# Patient Record
Sex: Female | Born: 1960 | Race: Black or African American | Hispanic: No | Marital: Single | Smoking: Current every day smoker
Health system: Southern US, Community
[De-identification: ages and names within clinical notes are randomized; demographics above are authoritative.]

## PROBLEM LIST (undated history)

## (undated) DIAGNOSIS — I639 Cerebral infarction, unspecified: Secondary | ICD-10-CM

## (undated) DIAGNOSIS — N3281 Overactive bladder: Secondary | ICD-10-CM

## (undated) DIAGNOSIS — K5900 Constipation, unspecified: Secondary | ICD-10-CM

## (undated) DIAGNOSIS — I69354 Hemiplegia and hemiparesis following cerebral infarction affecting left non-dominant side: Secondary | ICD-10-CM

## (undated) DIAGNOSIS — IMO0002 Reserved for concepts with insufficient information to code with codable children: Secondary | ICD-10-CM

## (undated) DIAGNOSIS — K219 Gastro-esophageal reflux disease without esophagitis: Secondary | ICD-10-CM

## (undated) DIAGNOSIS — I10 Essential (primary) hypertension: Secondary | ICD-10-CM

## (undated) DIAGNOSIS — R5381 Other malaise: Secondary | ICD-10-CM

## (undated) DIAGNOSIS — I69391 Dysphagia following cerebral infarction: Secondary | ICD-10-CM

## (undated) DIAGNOSIS — F17200 Nicotine dependence, unspecified, uncomplicated: Secondary | ICD-10-CM

## (undated) DIAGNOSIS — E875 Hyperkalemia: Secondary | ICD-10-CM

## (undated) DIAGNOSIS — D62 Acute posthemorrhagic anemia: Secondary | ICD-10-CM

## (undated) DIAGNOSIS — M4802 Spinal stenosis, cervical region: Secondary | ICD-10-CM

## (undated) HISTORY — DX: Overactive bladder: N32.81

## (undated) HISTORY — DX: Reserved for concepts with insufficient information to code with codable children: IMO0002

## (undated) HISTORY — DX: Hyperkalemia: E87.5

## (undated) HISTORY — DX: Acute posthemorrhagic anemia: D62

## (undated) HISTORY — DX: Hemiplegia and hemiparesis following cerebral infarction affecting left non-dominant side: I69.354

## (undated) HISTORY — DX: Gastro-esophageal reflux disease without esophagitis: K21.9

## (undated) HISTORY — DX: Constipation, unspecified: K59.00

## (undated) HISTORY — DX: Essential (primary) hypertension: I10

## (undated) HISTORY — DX: Cerebral infarction, unspecified: I63.9

## (undated) HISTORY — DX: Dysphagia following cerebral infarction: I69.391

## (undated) HISTORY — DX: Other malaise: R53.81

## (undated) HISTORY — DX: Nicotine dependence, unspecified, uncomplicated: F17.200

## (undated) HISTORY — DX: Spinal stenosis, cervical region: M48.02

---

## 2015-05-06 ENCOUNTER — Non-Acute Institutional Stay (SKILLED_NURSING_FACILITY): Payer: BLUE CROSS/BLUE SHIELD | Admitting: Internal Medicine

## 2015-05-06 DIAGNOSIS — I10 Essential (primary) hypertension: Secondary | ICD-10-CM

## 2015-05-06 DIAGNOSIS — I69354 Hemiplegia and hemiparesis following cerebral infarction affecting left non-dominant side: Secondary | ICD-10-CM

## 2015-05-06 DIAGNOSIS — I69391 Dysphagia following cerebral infarction: Secondary | ICD-10-CM

## 2015-05-06 DIAGNOSIS — K5900 Constipation, unspecified: Secondary | ICD-10-CM | POA: Diagnosis not present

## 2015-05-06 DIAGNOSIS — K219 Gastro-esophageal reflux disease without esophagitis: Secondary | ICD-10-CM | POA: Diagnosis not present

## 2015-05-06 DIAGNOSIS — R471 Dysarthria and anarthria: Secondary | ICD-10-CM

## 2015-05-06 DIAGNOSIS — N3281 Overactive bladder: Secondary | ICD-10-CM

## 2015-05-06 DIAGNOSIS — R5381 Other malaise: Secondary | ICD-10-CM

## 2015-05-06 DIAGNOSIS — I639 Cerebral infarction, unspecified: Secondary | ICD-10-CM | POA: Diagnosis not present

## 2015-05-06 DIAGNOSIS — E875 Hyperkalemia: Secondary | ICD-10-CM

## 2015-05-06 DIAGNOSIS — M4802 Spinal stenosis, cervical region: Secondary | ICD-10-CM | POA: Diagnosis not present

## 2015-05-06 DIAGNOSIS — D62 Acute posthemorrhagic anemia: Secondary | ICD-10-CM

## 2015-05-06 DIAGNOSIS — IMO0002 Reserved for concepts with insufficient information to code with codable children: Secondary | ICD-10-CM

## 2015-05-06 DIAGNOSIS — F17200 Nicotine dependence, unspecified, uncomplicated: Secondary | ICD-10-CM

## 2015-05-06 DIAGNOSIS — M62838 Other muscle spasm: Secondary | ICD-10-CM

## 2015-05-06 NOTE — Progress Notes (Signed)
Patient ID: Nancy Ray, female   DOB: September 27, 1960, 55 y.o.   MRN: [id number]      [facility]   PCP: No primary care provider on file.  Code Status: full code  No Known Allergies  Chief Complaint  Patient presents with  . New Admit To SNF     HPI:  55 y.o. patient is here for short term rehabilitation post hospital admission and inpatient rehabilitation stay from 04/07/15-05/04/15 with a CVA with left sided weakness. She was diagnosed with right basal ganglia embolic stroke. She was seen by neurology and is on plavix, aspirin and statin. She was seen by neurosurgery and not deemed a surgical candidate. She underwent inpatient rehabilitation. She received botox injection  For her left lower leg. She was treated for e.coli uti this admission. She received 1 u prbc this admission for blood loss anemia thought to be from gi bleed.  She has PMH of severe cervical stenosis with myelopathy, HTN, tobacco use, CVA, HLD among others. She is seen in her room today. She has dysarthria and left sided weakness. She has been constipated. Denies any other concerns.   Review of Systems:  Constitutional: Negative for fever, chills, diaphoresis.  HENT: Negative for headache, congestion, nasal discharge Eyes: Negative for blurred vision, double vision and discharge.  Respiratory: Negative for cough, shortness of breath and wheezing.   Cardiovascular: Negative for chest pain, palpitations, leg swelling.  Gastrointestinal: Negative for heartburn, nausea, vomiting, abdominal pain Genitourinary: Negative for dysuria and flank pain.  Musculoskeletal: Negative for back pain, falls in the facility Skin: Negative for itching, rash.  Neurological: positive for focal weakness Psychiatric/Behavioral: Negative for depression  PMH- HLD, HTN, CVA  PSH- Cesarean section  Social history- tobacco use 1 pack a day, denies alcohol and illicit drug use  Family history- cancer and  HTN in father and mother, stroke in mother and maternal grandmother   Medications:   Medication List       This list is accurate as of: 05/06/15  3:33 PM.  Always use your most recent med list.               amLODipine 10 MG tablet  Commonly known as:  NORVASC  Take 10 mg by mouth daily.     aspirin 81 MG tablet  Take 81 mg by mouth daily.     atorvastatin 80 MG tablet  Commonly known as:  LIPITOR  Take 80 mg by mouth daily.     baclofen 10 MG tablet  Commonly known as:  LIORESAL  Take 5 mg by mouth 3 (three) times daily.     clopidogrel 75 MG tablet  Commonly known as:  PLAVIX  Take 75 mg by mouth daily.     gabapentin 300 MG capsule  Commonly known as:  NEURONTIN  Take 300 mg by mouth 3 (three) times daily.     hydrochlorothiazide 12.5 MG capsule  Commonly known as:  MICROZIDE  Take 12.5 mg by mouth daily.     lidocaine 5 %  Commonly known as:  LIDODERM  Place 1 patch onto the skin daily. Remove & Discard patch within 12 hours or as directed by MD     oxybutynin 5 MG tablet  Commonly known as:  DITROPAN  Take 2.5 mg by mouth 2 (two) times daily.     pantoprazole 40 MG tablet  Commonly known as:  PROTONIX  Take 40 mg by mouth 2 (two) times daily.  sennosides-docusate sodium 8.6-50 MG tablet  Commonly known as:  SENOKOT-S  Take 2 tablets by mouth daily.     traMADol 50 MG tablet  Commonly known as:  ULTRAM  Take 50 mg by mouth every 6 (six) hours as needed.         Physical Exam: Filed Vitals:   05/06/15 1525  BP: 151/81  Pulse: 89  Temp: 98 F (36.7 C)  Resp: 18  SpO2: 96%    General- elderly female, in no acute distress Head- normocephalic, atraumatic Nose- no maxillary or frontal sinus tenderness, no nasal discharge Throat- moist mucus membrane Eyes- no pallor, no icterus, no discharge, normal conjunctiva, normal sclera Neck- no cervical lymphadenopathy Cardiovascular- normal s1,s2, no murmurs, no leg edema Respiratory- bilateral  clear to auscultation, no wheeze, no rhonchi, no crackles, no use of accessory muscles Abdomen- bowel sounds present, soft, non tender Musculoskeletal- left sided hemiparesis, able to move her RUE and RLE, no spinal and paraspinal tenderness Neurological- dysarhtira present, alert and oriented to person, place and time Skin- warm and dry Psychiatry- normal mood and affect    Labs and imaging reviewed: see chart for details   Assessment/Plan  Physical deconditioning Will have her work with physical therapy and occupational therapy team to help with gait training and muscle strengthening exercises.fall precautions. Skin care. Encourage to be out of bed.   Acute embolic stroke With left sided hemiparesis. Will have patient work with PT/OT as tolerated to regain strength and restore function.  Fall precautions are in place. Continue aspirin, plavix and lipitor. Check lipid panel and a1c  Left sided hemiparesis post cva, continue baclofen for muscle spasm. To work with therapy team. Fall precautions  Dysphagia Post CVA. Currently on dysphagia diet, aspiration precautions and will get SLP to evaluate. Continue puree diet for now.  Dysarthria To work with SLP team  Left sided spasticity Continue baclofen 5 mg tid and to work with therapy team  Blood loss anemia S/p 1 u prbc transfusion, check cbc  HTN Elevated SBP. Currently on norvasc 10 mg daily, hctz 12.5 mg daily. Was on clonidine, hydralazine and lisinopril prior. Check bp bid for now and adjust medication if needed   Cervical stenosis with myelopathy Not a surgical candidate. Continue tramadol prn for pain with lidocaine patch and neurontin  Constipation D/c colace. Continue senokot and start miralax 17 g daily with prn dulcolax suppository  Hyperkalemia In hospital, check bmp  Tobacco use Start nicotine patch 21 mg/24 hr for now and monitor  gerd Controlled symptom, continue protonix as above  OAB Continue  ditropan   Goals of care: short term rehabilitation   Labs/tests ordered: cbc, cmp, a1c, lipid  Family/ staff Communication: reviewed care plan with patient and nursing supervisor    Oneal Grout, MD  [facility] [phone] (Monday-Friday 8 am - 5 pm) [phone] (afterhours)

## 2015-05-11 LAB — HEPATIC FUNCTION PANEL
ALK PHOS: 56 U/L (ref 25–125)
ALT: 15 U/L (ref 7–35)
AST: 17 U/L (ref 13–35)
Bilirubin, Total: 0.5 mg/dL

## 2015-05-11 LAB — LIPID PANEL
CHOLESTEROL: 116 mg/dL (ref 0–200)
HDL: 38 mg/dL (ref 35–70)
LDL Cholesterol: 64 mg/dL
Triglycerides: 71 mg/dL (ref 40–160)

## 2015-05-11 LAB — CBC AND DIFFERENTIAL
HCT: 25 % — AB (ref 36–46)
HEMOGLOBIN: 8.3 g/dL — AB (ref 12.0–16.0)
Neutrophils Absolute: 2 /uL
PLATELETS: 203 10*3/uL (ref 150–399)
WBC: 4.4 10*3/mL

## 2015-05-11 LAB — BASIC METABOLIC PANEL
BUN: 10 mg/dL (ref 4–21)
CREATININE: 1.2 mg/dL — AB (ref 0.5–1.1)
Glucose: 100 mg/dL
POTASSIUM: 3.5 mmol/L (ref 3.4–5.3)
Sodium: 134 mmol/L — AB (ref 137–147)

## 2015-05-11 LAB — HEMOGLOBIN A1C: HEMOGLOBIN A1C: 5.4

## 2015-05-29 ENCOUNTER — Non-Acute Institutional Stay (SKILLED_NURSING_FACILITY): Payer: BLUE CROSS/BLUE SHIELD | Admitting: Adult Health

## 2015-05-29 ENCOUNTER — Encounter: Payer: Self-pay | Admitting: Adult Health

## 2015-05-29 DIAGNOSIS — D62 Acute posthemorrhagic anemia: Secondary | ICD-10-CM | POA: Diagnosis not present

## 2015-05-29 DIAGNOSIS — IMO0002 Reserved for concepts with insufficient information to code with codable children: Secondary | ICD-10-CM

## 2015-05-29 DIAGNOSIS — I69391 Dysphagia following cerebral infarction: Secondary | ICD-10-CM

## 2015-05-29 DIAGNOSIS — I69354 Hemiplegia and hemiparesis following cerebral infarction affecting left non-dominant side: Secondary | ICD-10-CM

## 2015-05-29 DIAGNOSIS — N3281 Overactive bladder: Secondary | ICD-10-CM

## 2015-05-29 DIAGNOSIS — K219 Gastro-esophageal reflux disease without esophagitis: Secondary | ICD-10-CM

## 2015-05-29 DIAGNOSIS — I639 Cerebral infarction, unspecified: Secondary | ICD-10-CM | POA: Diagnosis not present

## 2015-05-29 DIAGNOSIS — M4802 Spinal stenosis, cervical region: Secondary | ICD-10-CM

## 2015-05-29 DIAGNOSIS — K5900 Constipation, unspecified: Secondary | ICD-10-CM

## 2015-05-29 DIAGNOSIS — R471 Dysarthria and anarthria: Secondary | ICD-10-CM

## 2015-05-29 DIAGNOSIS — I10 Essential (primary) hypertension: Secondary | ICD-10-CM

## 2015-05-29 DIAGNOSIS — F17200 Nicotine dependence, unspecified, uncomplicated: Secondary | ICD-10-CM

## 2015-05-29 NOTE — Progress Notes (Signed)
Patient ID: Nancy Ray, female   DOB: 29-Apr-1960, 55 y.o.   MRN: [id number]    DATE:  05/29/15  MRN:  [id number]  BIRTHDAY: November 27, 1960  Facility:  Nursing Home Location:  [facility]  Nursing Home Room Number: 1001-2  LEVEL OF CARE:  SNF (31)  Contact Information    Name Relation Home Work Mobile   [place] Daughter [phone]     [place], [place] [phone]         Code Status History    This patient does not have a recorded code status. Please follow your organizational policy for patients in this situation.       Chief Complaint  Patient presents with  . Discharge Note    HISTORY OF PRESENT ILLNESS:  This is a 55 year old female who is for discharge home with home health OT for ADLs, PT for endurance, ST for swallowing, skilled Nurse for disease management and CNA for bath and showers. DME:  3 in 1 commode, standard wheelchair 16" deep X 16" wide, removable elevating leg rests and desk length arm rest, cushion. She has been admitted to [facility] on 05/05/15 from [facility] with CVA with a left sided weakness.She has PMH of severe cervical stenosis with myelopathy, hypertension, tobacco use, CVA and hyperlipidemia. She was diagnosed with right basal ganglia embolic stroke. She was seen by neurology and is on Plavix, aspirin and statin. She was seen by neurosurgery and not deemed a surgical candidate. She underwent inpatient rehabilitation. She received Botox injection for her left lower leg. She was treated for Escherichia coli UTI, as well. She received chest fusion of 1 unit packed RBC for blood loss anemia thought to be from GI bleed.   Patient was admitted to this facility for short-term rehabilitation after the patient's recent hospitalization.  Patient has completed SNF rehabilitation and therapy has cleared the patient for discharge.   PAST MEDICAL HISTORY:  Past Medical History  Diagnosis Date  . Physical  deconditioning   . Acute embolic stroke (HCC)   . Hemiplegia and hemiparesis following cerebral infarction affecting left non-dominant side (HCC)   . Acute blood loss anemia   . Constipation   . Benign essential HTN   . OAB (overactive bladder)   . GERD (gastroesophageal reflux disease)   . Tobacco use disorder   . Hyperkalemia   . Spinal stenosis in cervical region   . Dysphagia S/P CVA (cerebrovascular accident)   . Dysarthria due to cerebrovascular accident (CVA) (HCC)      CURRENT MEDICATIONS: Reviewed  Patient's Medications  New Prescriptions   No medications on file  Previous Medications   AMLODIPINE (NORVASC) 10 MG TABLET    Take 10 mg by mouth daily.   ASPIRIN 81 MG TABLET    Take 81 mg by mouth daily.   ATORVASTATIN (LIPITOR) 80 MG TABLET    Take 80 mg by mouth daily.   BACLOFEN (LIORESAL) 10 MG TABLET    Take 5 mg by mouth 3 (three) times daily.   BISACODYL (DULCOLAX) 10 MG SUPPOSITORY    Place 10 mg rectally daily as needed for moderate constipation.   CLONIDINE (CATAPRES) 0.1 MG TABLET    Take 0.1 mg by mouth 2 (two) times daily. Take 1/2 to 1 tab BID PRN SBP >160   CLOPIDOGREL (PLAVIX) 75 MG TABLET    Take 75 mg by mouth daily.   GABAPENTIN (NEURONTIN) 300 MG CAPSULE    Take 300 mg by mouth  3 (three) times daily.   HYDROCHLOROTHIAZIDE (MICROZIDE) 12.5 MG CAPSULE    Take 12.5 mg by mouth daily.   LIDOCAINE (LIDODERM) 5 %    Place 2 patches onto the skin daily. Remove & Discard patch within 12 hours or as directed by MD   NICOTINE (NICODERM CQ - DOSED IN MG/24 HOURS) 21 MG/24HR PATCH    Place 21 mg onto the skin daily.   OXYBUTYNIN (DITROPAN) 5 MG TABLET    Take 2.5 mg by mouth 2 (two) times daily.   POLYETHYLENE GLYCOL (MIRALAX / GLYCOLAX) PACKET    Take 17 g by mouth daily.   RANITIDINE (ZANTAC) 150 MG TABLET    Take 150 mg by mouth 2 (two) times daily.   SENNOSIDES-DOCUSATE SODIUM (SENOKOT-S) 8.6-50 MG TABLET    Take 2 tablets by mouth daily.    TRAMADOL (ULTRAM) 50  MG TABLET    Take 50 mg by mouth every 6 (six) hours as needed.  Modified Medications   No medications on file  Discontinued Medications   No medications on file     No Known Allergies   REVIEW OF SYSTEMS:  GENERAL: no change in appetite, no fatigue, no weight changes, no fever, chills or weakness SKIN: Denies rash, itching, wounds, ulcer sores, or nail abnormality EYES: Denies change in vision, dry eyes, eye pain, itching or discharge EARS: Denies change in hearing, ringing in ears, or earache NOSE: Denies nasal congestion or epistaxis MOUTH and THROAT: Denies oral discomfort, gingival pain or bleeding, pain from teeth or hoarseness   RESPIRATORY: no cough, SOB, DOE, wheezing, hemoptysis CARDIAC: no chest pain, edema or palpitations GI: no abdominal pain, diarrhea, constipation, heart burn, nausea or vomiting GU: Denies dysuria, frequency, hematuria, incontinence, or discharge PSYCHIATRIC: Denies feeling of depression or anxiety. No report of hallucinations, insomnia, paranoia, or agitation    PHYSICAL EXAMINATION  GENERAL APPEARANCE: Well nourished. In no acute distress. Normal body habitus SKIN:  Skin is warm and dry. There are no suspicious lesions or rash HEAD: Normal in size and contour. No evidence of trauma EYES: Lids open and close normally. No blepharitis, entropion or ectropion. PERRL. Conjunctivae are clear and sclerae are white. Lenses are without opacity EARS: Pinnae are normal. Patient hears normal voice tunes of the examiner MOUTH and THROAT: Lips are without lesions. Oral mucosa is moist and without lesions. Tongue is normal in shape, size, and color and without lesions NECK: supple, trachea midline, no neck masses, no thyroid tenderness, no thyromegaly LYMPHATICS: no LAN in the neck, no supraclavicular LAN RESPIRATORY: breathing is even & unlabored, BS CTAB CARDIAC: RRR, no murmur,no extra heart sounds, no edema GI: abdomen soft, normal BS, no masses, no  tenderness, no hepatomegaly, no splenomegaly EXTREMITIES:  Left hemiplegia, uses wheelchair PSYCHIATRIC: Alert and oriented X 3. Affect and behavior are appropriate  LABS/RADIOLOGY: Labs reviewed: Basic Metabolic Panel:  Recent Labs  16/10/96  NA 134*  K 3.5  BUN 10  CREATININE 1.2*   Liver Function Tests:  Recent Labs  05/11/15  AST 17  ALT 15  ALKPHOS 56   CBC:  Recent Labs  05/11/15  WBC 4.4  NEUTROABS 2  HGB 8.3*  HCT 25*  PLT 203   Lipid Panel:  Recent Labs  05/11/15  HDL 38     ASSESSMENT/PLAN:  CVA with left-sided hemiparesis - for home health PT, OT, ST, CNA and skilled Nurse; continue Plavix 75 mg 1 tab by mouth daily, baclofen 10 mg 1 tab 3 times  a day  and aspirin 81 mg 1 tab daily  Dysphagia - continue mechanical soft NAS diet; aspiration precaution  Dysarthria - will be followed up by home health ST  Anemia, acute blood loss - S/P revision of 1 unit packed RBC, hemoglobin 8.3  Hypertension - continue hydrochlorothiazide 12.5 mg 1 tab daily, Norvasc 10 mg 1 tab daily, clonidine 0.1 mg 1/2 tab = 0.05 mg PO BID and Clonidine 0.1 mg 1 tab PO BID PRN  Cervical stenosis with myelopathy - not a surgical candidate per neurosurgery; continue tramadol when necessary, gabapentin 300 mg 1 capsule 3 times a day and lidocaine patch 5% 2 patches daily for pain  Tobacco use - continue nicotine patch 21 mg/24-hour daily  GERD - continue Zantac 150 mg 1 tab by mouth twice a day  OAB - continue oxybutynin 5 mg 1/2 tab = 2.5 mg BID      I have filled out patient's discharge paperwork and written prescriptions.  Patient will receive home health PT, OT, ST, skilled Nursing and CNA.  DME provided:  3 in 1 commode, standard wheelchair 16" deep X 16" wide, removable elevating leg rests and desk length arm rest, cushion  Total discharge time: Greater than 30 minutes  Discharge time involved coordination of the discharge process with social worker, nursing  staff and therapy department. Medical justification for home health services/DME verified.     [facility], NP [employer] [phone]

## 2015-06-13 ENCOUNTER — Encounter (HOSPITAL_BASED_OUTPATIENT_CLINIC_OR_DEPARTMENT_OTHER): Payer: Self-pay | Admitting: Emergency Medicine

## 2015-06-13 ENCOUNTER — Emergency Department (HOSPITAL_BASED_OUTPATIENT_CLINIC_OR_DEPARTMENT_OTHER)
Admission: EM | Admit: 2015-06-13 | Discharge: 2015-06-13 | Disposition: A | Discharge status: Home / Self Care | Payer: BLUE CROSS/BLUE SHIELD | Attending: Emergency Medicine | Admitting: Emergency Medicine

## 2015-06-13 ENCOUNTER — Emergency Department (HOSPITAL_BASED_OUTPATIENT_CLINIC_OR_DEPARTMENT_OTHER): Payer: BLUE CROSS/BLUE SHIELD

## 2015-06-13 DIAGNOSIS — Y998 Other external cause status: Secondary | ICD-10-CM | POA: Insufficient documentation

## 2015-06-13 DIAGNOSIS — Y9389 Activity, other specified: Secondary | ICD-10-CM | POA: Insufficient documentation

## 2015-06-13 DIAGNOSIS — K219 Gastro-esophageal reflux disease without esophagitis: Secondary | ICD-10-CM | POA: Diagnosis not present

## 2015-06-13 DIAGNOSIS — Z8639 Personal history of other endocrine, nutritional and metabolic disease: Secondary | ICD-10-CM | POA: Insufficient documentation

## 2015-06-13 DIAGNOSIS — Z862 Personal history of diseases of the blood and blood-forming organs and certain disorders involving the immune mechanism: Secondary | ICD-10-CM | POA: Insufficient documentation

## 2015-06-13 DIAGNOSIS — Z7982 Long term (current) use of aspirin: Secondary | ICD-10-CM | POA: Diagnosis not present

## 2015-06-13 DIAGNOSIS — W06XXXA Fall from bed, initial encounter: Secondary | ICD-10-CM | POA: Diagnosis not present

## 2015-06-13 DIAGNOSIS — F17210 Nicotine dependence, cigarettes, uncomplicated: Secondary | ICD-10-CM | POA: Insufficient documentation

## 2015-06-13 DIAGNOSIS — I10 Essential (primary) hypertension: Secondary | ICD-10-CM | POA: Diagnosis not present

## 2015-06-13 DIAGNOSIS — K5900 Constipation, unspecified: Secondary | ICD-10-CM | POA: Insufficient documentation

## 2015-06-13 DIAGNOSIS — Y9289 Other specified places as the place of occurrence of the external cause: Secondary | ICD-10-CM | POA: Diagnosis not present

## 2015-06-13 DIAGNOSIS — Z79899 Other long term (current) drug therapy: Secondary | ICD-10-CM | POA: Insufficient documentation

## 2015-06-13 DIAGNOSIS — Z8673 Personal history of transient ischemic attack (TIA), and cerebral infarction without residual deficits: Secondary | ICD-10-CM | POA: Insufficient documentation

## 2015-06-13 DIAGNOSIS — Z87448 Personal history of other diseases of urinary system: Secondary | ICD-10-CM | POA: Insufficient documentation

## 2015-06-13 DIAGNOSIS — Z7902 Long term (current) use of antithrombotics/antiplatelets: Secondary | ICD-10-CM | POA: Insufficient documentation

## 2015-06-13 DIAGNOSIS — S79912A Unspecified injury of left hip, initial encounter: Secondary | ICD-10-CM | POA: Insufficient documentation

## 2015-06-13 DIAGNOSIS — M25552 Pain in left hip: Secondary | ICD-10-CM

## 2015-06-13 NOTE — ED Notes (Signed)
Pt in c/o L hip pain onset after falling 3 weeks ago. Was not evaluated at that time, but is concerned now because of continued pain and swelling. Pt is not ambulatory at baseline.

## 2015-06-13 NOTE — ED Provider Notes (Signed)
CSN: [id number]     Arrival date & time 06/13/15  1407 History   First MD Initiated Contact with Patient 06/13/15 1425     Chief Complaint  Patient presents with  . Hip Pain  . Fall     (Consider location/radiation/quality/duration/timing/severity/associated sxs/prior Treatment) HPI Kinaya Hilliker is a 55 y.o. female with PMH significant for CVA with left sided hemiplegia and hemiparesis who presents with constant, moderate, intermittent left hip pain. Patient reports she fell out of her bed 3 weeks ago landing on her left hip. Denies LOC or head injury. At the time, patient did not feel she needed to seek medical treatment. Associated symptoms include swelling that is relieved with ice. No medications PTA. Denies numbness, tingling, acute weakness, chest pain, shortness of breath. Patient is not ambulatory at baseline.  Past Medical History  Diagnosis Date  . Physical deconditioning   . Acute embolic stroke (HCC)   . Hemiplegia and hemiparesis following cerebral infarction affecting left non-dominant side (HCC)   . Acute blood loss anemia   . Constipation   . Benign essential HTN   . OAB (overactive bladder)   . GERD (gastroesophageal reflux disease)   . Tobacco use disorder   . Hyperkalemia   . Spinal stenosis in cervical region   . Dysphagia S/P CVA (cerebrovascular accident)   . Dysarthria due to cerebrovascular accident (CVA) [facility])    Past Surgical History  Procedure Laterality Date  . Cesarean section     History reviewed. No pertinent family history. Social History  Substance Use Topics  . Smoking status: Current Every Day Smoker -- 1.00 packs/day    Types: Cigarettes  . Smokeless tobacco: None  . Alcohol Use: No   OB History    No data available     Review of Systems All other systems negative unless otherwise stated in HPI    Allergies  Review of patient's allergies indicates no known allergies.  Home Medications   Prior to Admission medications    Medication Sig Start Date End Date Taking? Authorizing Provider  amLODipine (NORVASC) 10 MG tablet Take 10 mg by mouth daily.    Historical Provider, MD  aspirin 81 MG tablet Take 81 mg by mouth daily.    Historical Provider, MD  atorvastatin (LIPITOR) 80 MG tablet Take 80 mg by mouth daily.    Historical Provider, MD  baclofen (LIORESAL) 10 MG tablet Take 5 mg by mouth 3 (three) times daily.    Historical Provider, MD  bisacodyl (DULCOLAX) 10 MG suppository Place 10 mg rectally daily as needed for moderate constipation.    Historical Provider, MD  cloNIDine (CATAPRES) 0.1 MG tablet Take 0.1 mg by mouth 2 (two) times daily. Take 1/2 to 1 tab BID PRN SBP >160    Historical Provider, MD  clopidogrel (PLAVIX) 75 MG tablet Take 75 mg by mouth daily.    Historical Provider, MD  gabapentin (NEURONTIN) 300 MG capsule Take 300 mg by mouth 3 (three) times daily.    Historical Provider, MD  hydrochlorothiazide (MICROZIDE) 12.5 MG capsule Take 12.5 mg by mouth daily.    Historical Provider, MD  lidocaine (LIDODERM) 5 % Place 2 patches onto the skin daily. Remove & Discard patch within 12 hours or as directed by MD    Historical Provider, MD  nicotine (NICODERM CQ - DOSED IN MG/24 HOURS) 21 mg/24hr patch Place 21 mg onto the skin daily.    Historical Provider, MD  oxybutynin (DITROPAN) 5 MG tablet Take 2.5 mg  by mouth 2 (two) times daily.    Historical Provider, MD  polyethylene glycol (MIRALAX / GLYCOLAX) packet Take 17 g by mouth daily.    Historical Provider, MD  ranitidine (ZANTAC) 150 MG tablet Take 150 mg by mouth 2 (two) times daily.    Historical Provider, MD  sennosides-docusate sodium (SENOKOT-S) 8.6-50 MG tablet Take 2 tablets by mouth daily.     Historical Provider, MD  traMADol (ULTRAM) 50 MG tablet Take 50 mg by mouth every 6 (six) hours as needed.    Historical Provider, MD   BP 153/79 mmHg  Pulse 89  Temp(Src) 98.1 F (36.7 C) (Oral)  Resp 18  Ht 5\' 2"  (1.575 m)  Wt 49.896 kg  BMI  20.11 kg/m2  SpO2 100% Physical Exam  Constitutional: She is oriented to person, place, and time. She appears well-developed and well-nourished.  HENT:  Head: Normocephalic and atraumatic.  Right Ear: External ear normal.  Left Ear: External ear normal.  Eyes: Conjunctivae are normal. No scleral icterus.  Neck: No tracheal deviation present.  Cardiovascular: Intact distal pulses.   Pulses:      Posterior tibial pulses are 2+ on the right side, and 2+ on the left side.  Good perfusion of left leg.  Pulmonary/Chest: Effort normal. No respiratory distress.  Abdominal: She exhibits no distension.  Musculoskeletal: She exhibits no tenderness.  Left hip non-tender to palpation.  Decreased ROM.  No erythema, warmth, swelling, ecchymosis.  Skin intact.   Neurological: She is alert and oriented to person, place, and time.  Left-sided weakness (baseline).  Sensation intact.   Skin: Skin is warm and dry.  Psychiatric: She has a normal mood and affect. Her behavior is normal.    ED Course  Procedures (including critical care time) Labs Review Labs Reviewed - No data to display  Imaging Review Dg Hip Unilat With Pelvis 2-3 Views Left  06/13/2015  CLINICAL DATA:  Pain after fall 3 weeks ago. EXAM: DG HIP (WITH OR WITHOUT PELVIS) 2-3V LEFT COMPARISON:  None. FINDINGS: There is no evidence of hip fracture or dislocation. There is no evidence of arthropathy or other focal bone abnormality. IMPRESSION: Negative. Electronically Signed   By: Gerome Samavid  Williams III M.D   On: 06/13/2015 14:50   I have personally reviewed and evaluated these images and lab results as part of my medical decision-making.   EKG Interpretation None      MDM   Final diagnoses:  Left hip pain    VSS, NAD.  Plain films negative for acute abnormalities.  Possible bursitis.  Recommend continued ice and tylenol or ibuprofen for symptom control.  No signs of infection.  No skin breakdown. Good perfusion, doubt DVT or  arterial thrombus.  Follow up PCP.  Discussed return precautions.  Patient agrees and acknowledges the above plan for discharge.  Case has been discussed with Dr. Fayrene FearingJames who agrees with the above plan for discharge.      Cheri FowlerKayla Jonathan Corpus, PA-C 06/13/15 1525  Rolland PorterMark James, MD 06/22/15 [phone]

## 2015-06-13 NOTE — Discharge Instructions (Signed)
1. Continue home medications. 2. You may take Tylenol or Ibuprofen for your pain.  Continue rest, ice, and elevation of your left hip.  3.  Follow up with your PCP as needed.  Hip Pain Your hip is the joint between your upper legs and your lower pelvis. The bones, cartilage, tendons, and muscles of your hip joint perform a lot of work each day supporting your body weight and allowing you to move around. Hip pain can range from a minor ache to severe pain in one or both of your hips. Pain may be felt on the inside of the hip joint near the groin, or the outside near the buttocks and upper thigh. You may have swelling or stiffness as well.  HOME CARE INSTRUCTIONS   Take medicines only as directed by your health care provider.  Apply ice to the injured area:  Put ice in a plastic bag.  Place a towel between your skin and the bag.  Leave the ice on for 15-20 minutes at a time, 3-4 times a day.  Keep your leg raised (elevated) when possible to lessen swelling.  Avoid activities that cause pain.  Follow specific exercises as directed by your health care provider.  Sleep with a pillow between your legs on your most comfortable side.  Record how often you have hip pain, the location of the pain, and what it feels like. SEEK MEDICAL CARE IF:   You are unable to put weight on your leg.  Your hip is red or swollen or very tender to touch.  Your pain or swelling continues or worsens after 1 week.  You have increasing difficulty walking.  You have a fever. SEEK IMMEDIATE MEDICAL CARE IF:   You have fallen.  You have a sudden increase in pain and swelling in your hip. MAKE SURE YOU:   Understand these instructions.  Will watch your condition.  Will get help right away if you are not doing well or get worse.   This information is not intended to replace advice given to you by your health care provider. Make sure you discuss any questions you have with your health care provider.   Document Released: 09/15/2009 Document Revised: 04/18/2014 Document Reviewed: 11/22/2012 Elsevier Interactive Patient Education [employer]2016 [employer] Inc.

## 2015-06-13 NOTE — ED Notes (Signed)
Pt transported to XR at this time.

## 2015-06-19 ENCOUNTER — Other Ambulatory Visit: Payer: Self-pay | Admitting: Adult Health

## 2015-09-01 ENCOUNTER — Other Ambulatory Visit: Payer: Self-pay | Admitting: Adult Health

## 2017-04-17 IMAGING — DX DG HIP (WITH OR WITHOUT PELVIS) 2-3V*L*
3 series · 3 of 3 positions shown · non-contrast
Comparison: None.

CLINICAL DATA: Pain after fall 3 weeks ago.

EXAM:
DG HIP (WITH OR WITHOUT PELVIS) 2-3V LEFT

[pelvis ap]
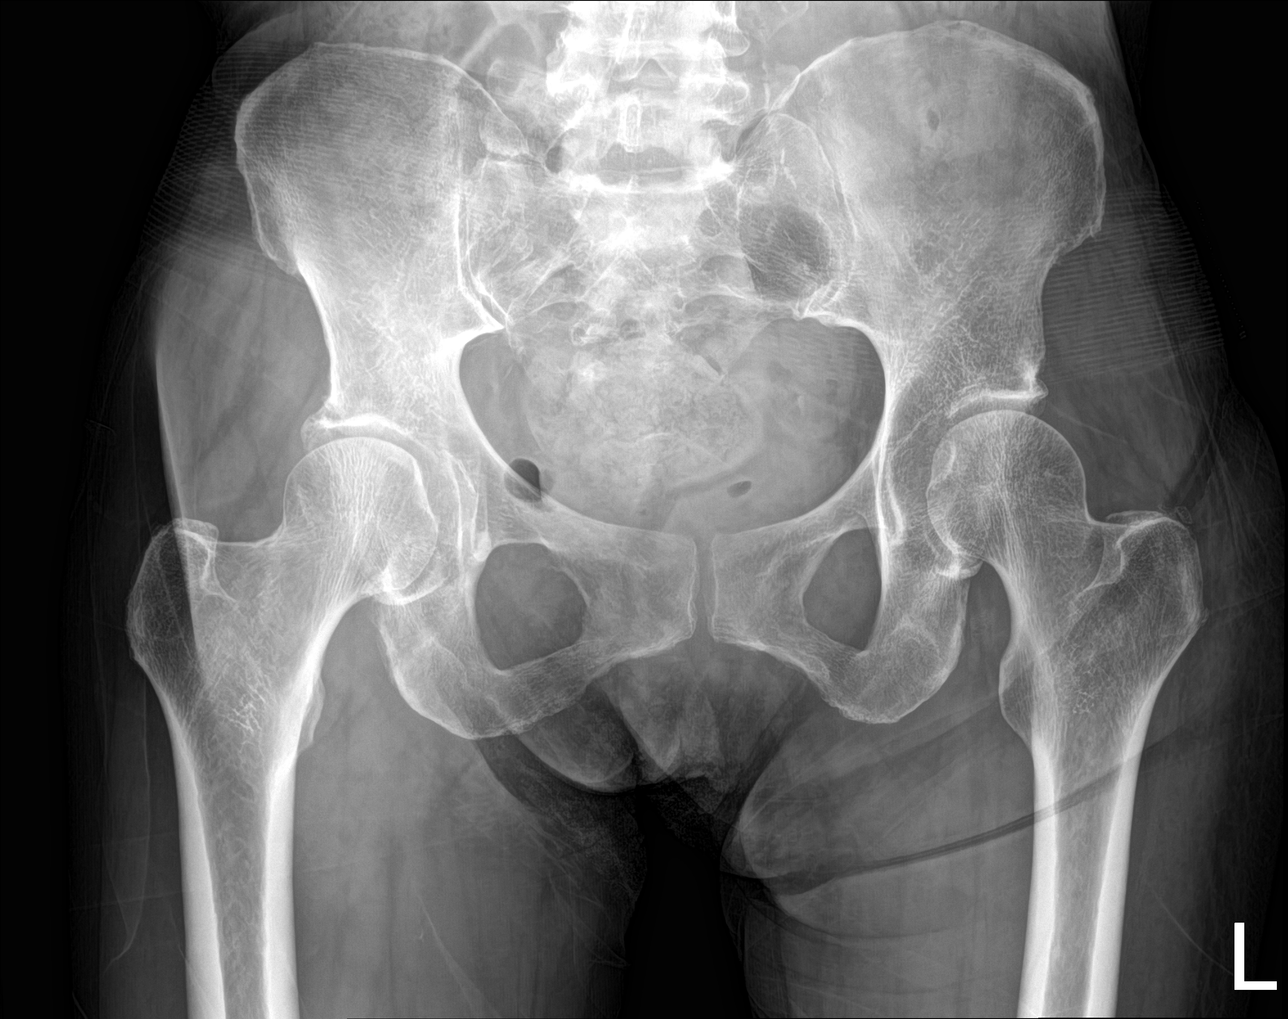

[hip ap]
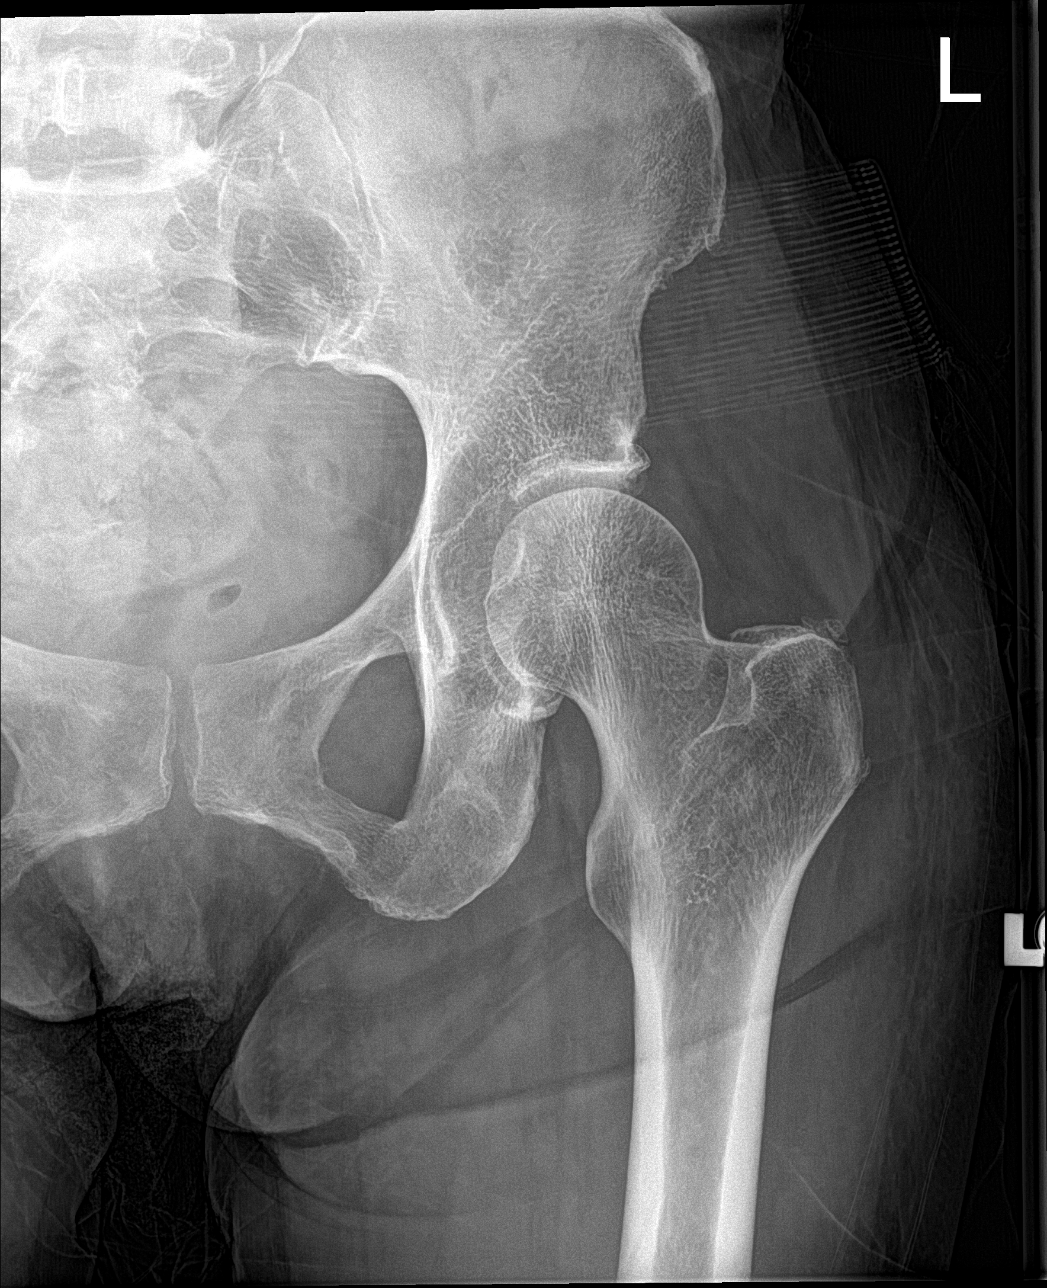

[hip lat]
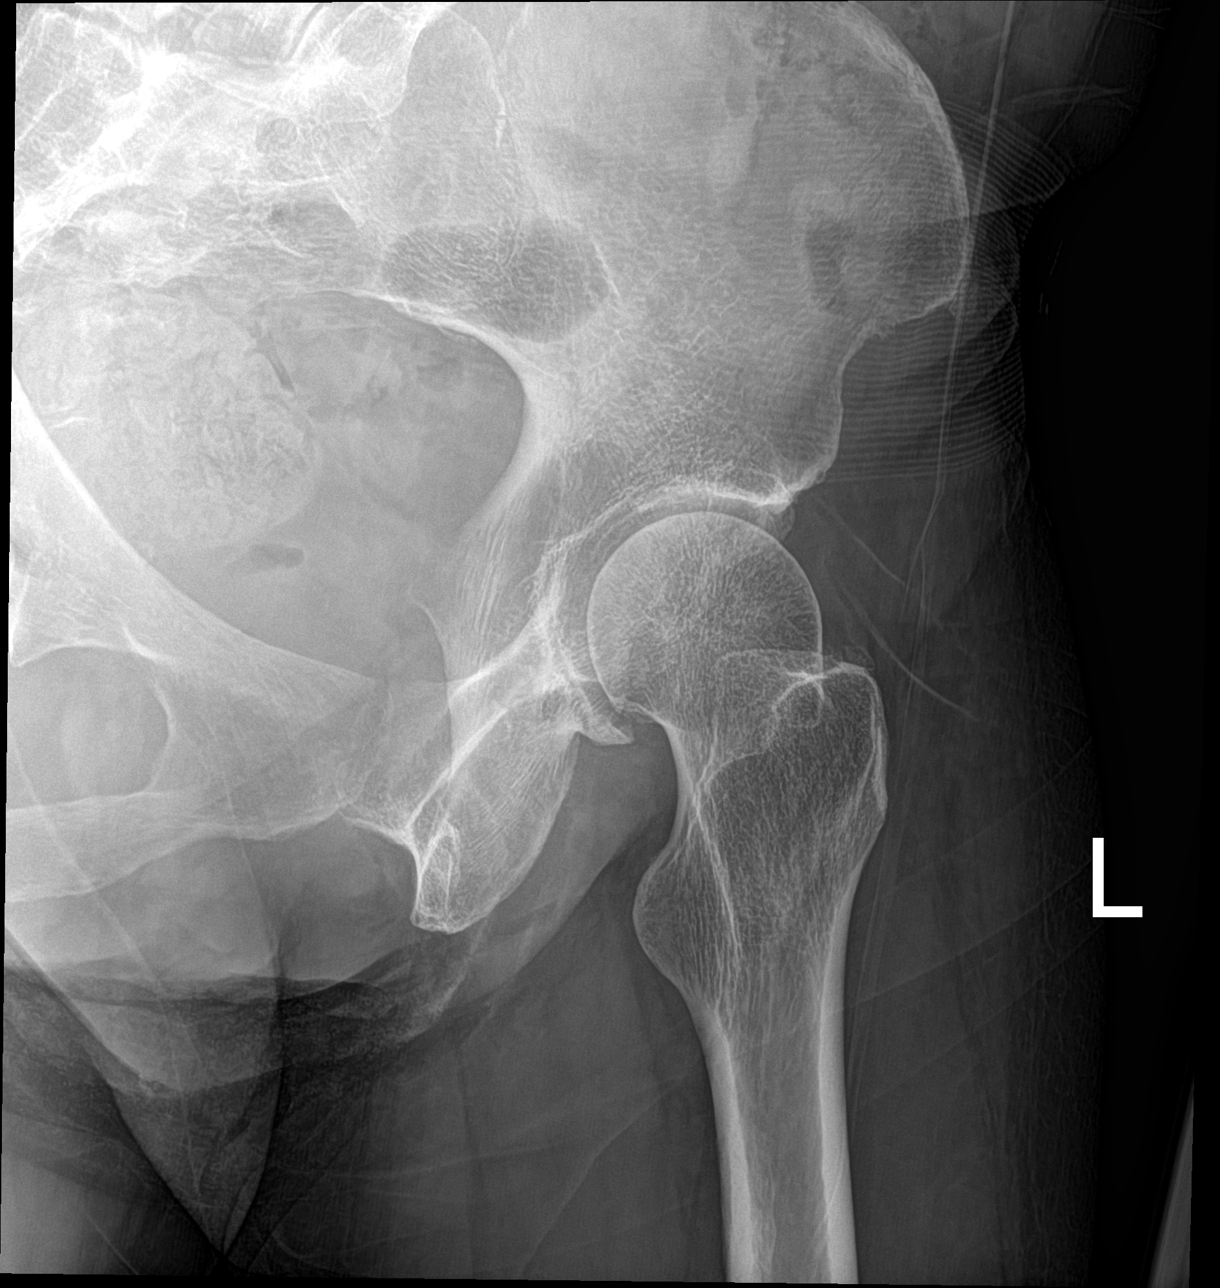

[3 of 3 positions shown; findings below may reference images not displayed]

FINDINGS: There is no evidence of hip fracture or dislocation. There is no
evidence of arthropathy or other focal bone abnormality.
IMPRESSION: Negative.
